# Patient Record
Sex: Female | Born: 1995 | Race: Black or African American | Hispanic: No | Marital: Single
Health system: Southern US, Community
[De-identification: ages and names within clinical notes are randomized; demographics above are authoritative.]

---

## 2019-10-29 ENCOUNTER — Emergency Department
Admission: EM | Admit: 2019-10-29 | Discharge: 2019-10-29 | Disposition: A | Discharge status: Home / Self Care | Payer: Medicaid Other | Attending: Emergency Medicine | Admitting: Emergency Medicine

## 2019-10-29 ENCOUNTER — Emergency Department: Payer: Medicaid Other

## 2019-10-29 ENCOUNTER — Other Ambulatory Visit: Payer: Self-pay

## 2019-10-29 DIAGNOSIS — F17200 Nicotine dependence, unspecified, uncomplicated: Secondary | ICD-10-CM | POA: Insufficient documentation

## 2019-10-29 DIAGNOSIS — R0602 Shortness of breath: Secondary | ICD-10-CM | POA: Diagnosis not present

## 2019-10-29 DIAGNOSIS — M79662 Pain in left lower leg: Secondary | ICD-10-CM | POA: Insufficient documentation

## 2019-10-29 DIAGNOSIS — M79661 Pain in right lower leg: Secondary | ICD-10-CM | POA: Diagnosis not present

## 2019-10-29 DIAGNOSIS — Z91040 Latex allergy status: Secondary | ICD-10-CM | POA: Diagnosis not present

## 2019-10-29 DIAGNOSIS — Z79899 Other long term (current) drug therapy: Secondary | ICD-10-CM | POA: Insufficient documentation

## 2019-10-29 DIAGNOSIS — R079 Chest pain, unspecified: Secondary | ICD-10-CM | POA: Diagnosis present

## 2019-10-29 LAB — CBC
HCT: 40.6 % (ref 36.0–46.0)
Hemoglobin: 14.1 g/dL (ref 12.0–15.0)
MCH: 29.3 pg (ref 26.0–34.0)
MCHC: 34.7 g/dL (ref 30.0–36.0)
MCV: 84.2 fL (ref 80.0–100.0)
Platelets: 234 10*3/uL (ref 150–400)
RBC: 4.82 MIL/uL (ref 3.87–5.11)
RDW: 12.9 % (ref 11.5–15.5)
WBC: 5.1 10*3/uL (ref 4.0–10.5)
nRBC: 0 % (ref 0.0–0.2)

## 2019-10-29 LAB — BASIC METABOLIC PANEL
Anion gap: 9 (ref 5–15)
BUN: 13 mg/dL (ref 6–20)
CO2: 25 mmol/L (ref 22–32)
Calcium: 9.4 mg/dL (ref 8.9–10.3)
Chloride: 103 mmol/L (ref 98–111)
Creatinine, Ser: 0.69 mg/dL (ref 0.44–1.00)
GFR calc Af Amer: >60 mL/min (ref >60)
GFR calc non Af Amer: >60 mL/min (ref >60)
Glucose, Bld: 129 mg/dL — ABNORMAL HIGH (ref 70–99)
Potassium: 3.7 mmol/L (ref 3.5–5.1)
Sodium: 137 mmol/L (ref 135–145)

## 2019-10-29 LAB — TROPONIN I (HIGH SENSITIVITY): Troponin I (High Sensitivity): <2 ng/L (ref <18)

## 2019-10-29 LAB — FIBRIN DERIVATIVES D-DIMER (ARMC ONLY): Fibrin derivatives D-dimer (ARMC): 212.4 ng/mL (FEU) (ref 0.00–499.00)

## 2019-10-29 LAB — POCT PREGNANCY, URINE: Preg Test, Ur: NEGATIVE

## 2019-10-29 NOTE — ED Triage Notes (Signed)
Patient reports chest pain off/on for several weeks, worse tonight.  Reports had episode approximately a year ago but no follow up.

## 2019-10-29 NOTE — ED Notes (Signed)
Patient to stat desk in no acute distress asking about wait time. Patient given update on wait time. Patient verbalizes understanding.  

## 2019-10-29 NOTE — ED Notes (Signed)
Pt c/o CP last night while eating. Pt states she was unable to eat her food d/t the pain and also SOB. 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

## 2019-10-29 NOTE — ED Provider Notes (Signed)
[facility] Emergency Department Provider Note  ____________________________________________   First MD Initiated Contact with Patient 10/29/19 [phone]     (approximate)  I have reviewed the triage vital signs and the nursing notes.   HISTORY  Chief Complaint Chest Pain    HPI Kristi Stewart is a 24 y.o. female presents emergency department complaining of some chest pain and shortness of breath.  Patient has a history of a questionable MI in March.  Patient states her legs have been hurting.  Patient does smoke and is on birth control, she denies any fever or chills.  No vomiting or diarrhea.  No known Covid exposure.    No past medical history on file.  There are no problems to display for this patient.     Prior to Admission medications   Not on File    Allergies Mushroom extract complex and Latex  No family history on file.  Social History Social History   Tobacco Use  . Smoking status: Not on file  Substance Use Topics  . Alcohol use: Not on file  . Drug use: Not on file    Review of Systems  Constitutional: No fever/chills Eyes: No visual changes. ENT: No sore throat. Respiratory: Denies cough, positive shortness of breath Cardiovascular: Positive chest pain Gastrointestinal: Denies abdominal pain Genitourinary: Negative for dysuria. Musculoskeletal: Negative for back pain. Skin: Negative for rash. Psychiatric: no mood changes,     ____________________________________________   PHYSICAL EXAM:  VITAL SIGNS: ED Triage Vitals  Enc Vitals Group     BP 10/29/19 0047 123/78     Pulse Rate 10/29/19 0047 77     Resp 10/29/19 0047 16     Temp 10/29/19 0047 99.1 F (37.3 C)     Temp Source 10/29/19 0047 Oral     SpO2 10/29/19 0047 100 %     Weight 10/29/19 0045 125 lb (56.7 kg)     Height 10/29/19 0045 4\' 11"  (1.499 m)     Head Circumference --      Peak Flow --      Pain Score 10/29/19 0045 3     Pain Loc  --      Pain Edu? --      Excl. in [place]? --     Constitutional: Alert and oriented. Well appearing and in no acute distress. Eyes: Conjunctivae are normal.  Head: Atraumatic. Nose: No congestion/rhinnorhea. Mouth/Throat: Mucous membranes are moist.   Neck:  supple no lymphadenopathy noted Cardiovascular: Normal rate, regular rhythm. Heart sounds are normal Respiratory: Normal respiratory effort.  No retractions, lungs c t a  GU: deferred Musculoskeletal: FROM all extremities, warm and well perfused Neurologic:  Normal speech and language.  Skin:  Skin is warm, dry and intact. No rash noted. Psychiatric: Mood and affect are normal. Speech and behavior are normal.  ____________________________________________   LABS (all labs ordered are listed, but only abnormal results are displayed)  Labs Reviewed  BASIC METABOLIC PANEL - Abnormal; Notable for the following components:      Result Value   Glucose, Bld 129 (*)    All other components within normal limits  CBC  FIBRIN DERIVATIVES D-DIMER ([facility] ONLY)  POC URINE PREG, ED  POCT PREGNANCY, URINE  TROPONIN I (HIGH SENSITIVITY)  TROPONIN I (HIGH SENSITIVITY)   ____________________________________________   ____________________________________________  RADIOLOGY  Chest x-ray is normal  ____________________________________________   PROCEDURES  Procedure(s) performed: No  Procedures    ____________________________________________   INITIAL IMPRESSION /  ASSESSMENT AND PLAN / ED COURSE  Pertinent labs & imaging results that were available during my care of the patient were reviewed by me and considered in my medical decision making (see chart for details).   The patient is a 24 year old female presents emergency department complaining of some chest pain and shortness of breath.  Patient is on birth control, is a smoker, and is having lower leg pain.  Physical exam is basically unremarkable  Will order a ddimer to  see if the patient needs a ct scan.  If neg she can be discharged.  She appears to be stable at this time.  DDx: Chest wall pain, PE, Covid, pneumonia  CBC is normal, basic metabolic panel is normal, troponin is normal Chest x-ray is normal   D-dimer is normal.  Discussed the findings with patient.  I am asked her if she thought she would need a Covid test.  She states she does not feel like she is actually sick.  She states that her chest does feel better than it did when she got here.  Instructed her to return immediately if she begins to have worsening chest pain or any shortness of breath.  She states she understands will comply.  She was discharged and given a work note.   Kristi Stewart was evaluated in Emergency Department on 10/29/2019 for the symptoms described in the history of present illness. She was evaluated in the context of the global COVID-19 pandemic, which necessitated consideration that the patient might be at risk for infection with the SARS-CoV-2 virus that causes COVID-19. Institutional protocols and algorithms that pertain to the evaluation of patients at risk for COVID-19 are in a state of rapid change based on information released by regulatory bodies including the CDC and federal and state organizations. These policies and algorithms were followed during the patient's care in the ED.   As part of my medical decision making, I reviewed the following data within the electronic MEDICAL RECORD NUMBER Nursing notes reviewed and incorporated, Labs reviewed see above, EKG interpreted NSR, Old chart reviewed, Radiograph reviewed chest x-ray is normal, Notes from prior ED visits and [place] Controlled Substance Database  ____________________________________________   FINAL CLINICAL IMPRESSION(S) / ED DIAGNOSES  Final diagnoses:  Nonspecific chest pain      NEW MEDICATIONS STARTED DURING THIS VISIT:  New Prescriptions   No medications on file     Note:  This  document was prepared using Dragon voice recognition software and may include unintentional dictation errors.    Faythe Ghee, PA-C 10/29/19 1012    Phineas Semen, MD 10/29/19 [phone]

## 2019-10-29 NOTE — Discharge Instructions (Signed)
Follow-up with your regular doctor if not improving in 3 days.  Return emergency department worsening.  Tylenol and ibuprofen as needed.  If you start to run a fever and have a cough please return the emergency department.

## 2020-10-15 IMAGING — CR DG CHEST 2V
2 series · 2 of 2 positions shown · non-contrast
Comparison: None.

CLINICAL DATA: Chest pain for several weeks

EXAM:
CHEST - 2 VIEW

[chest pa]
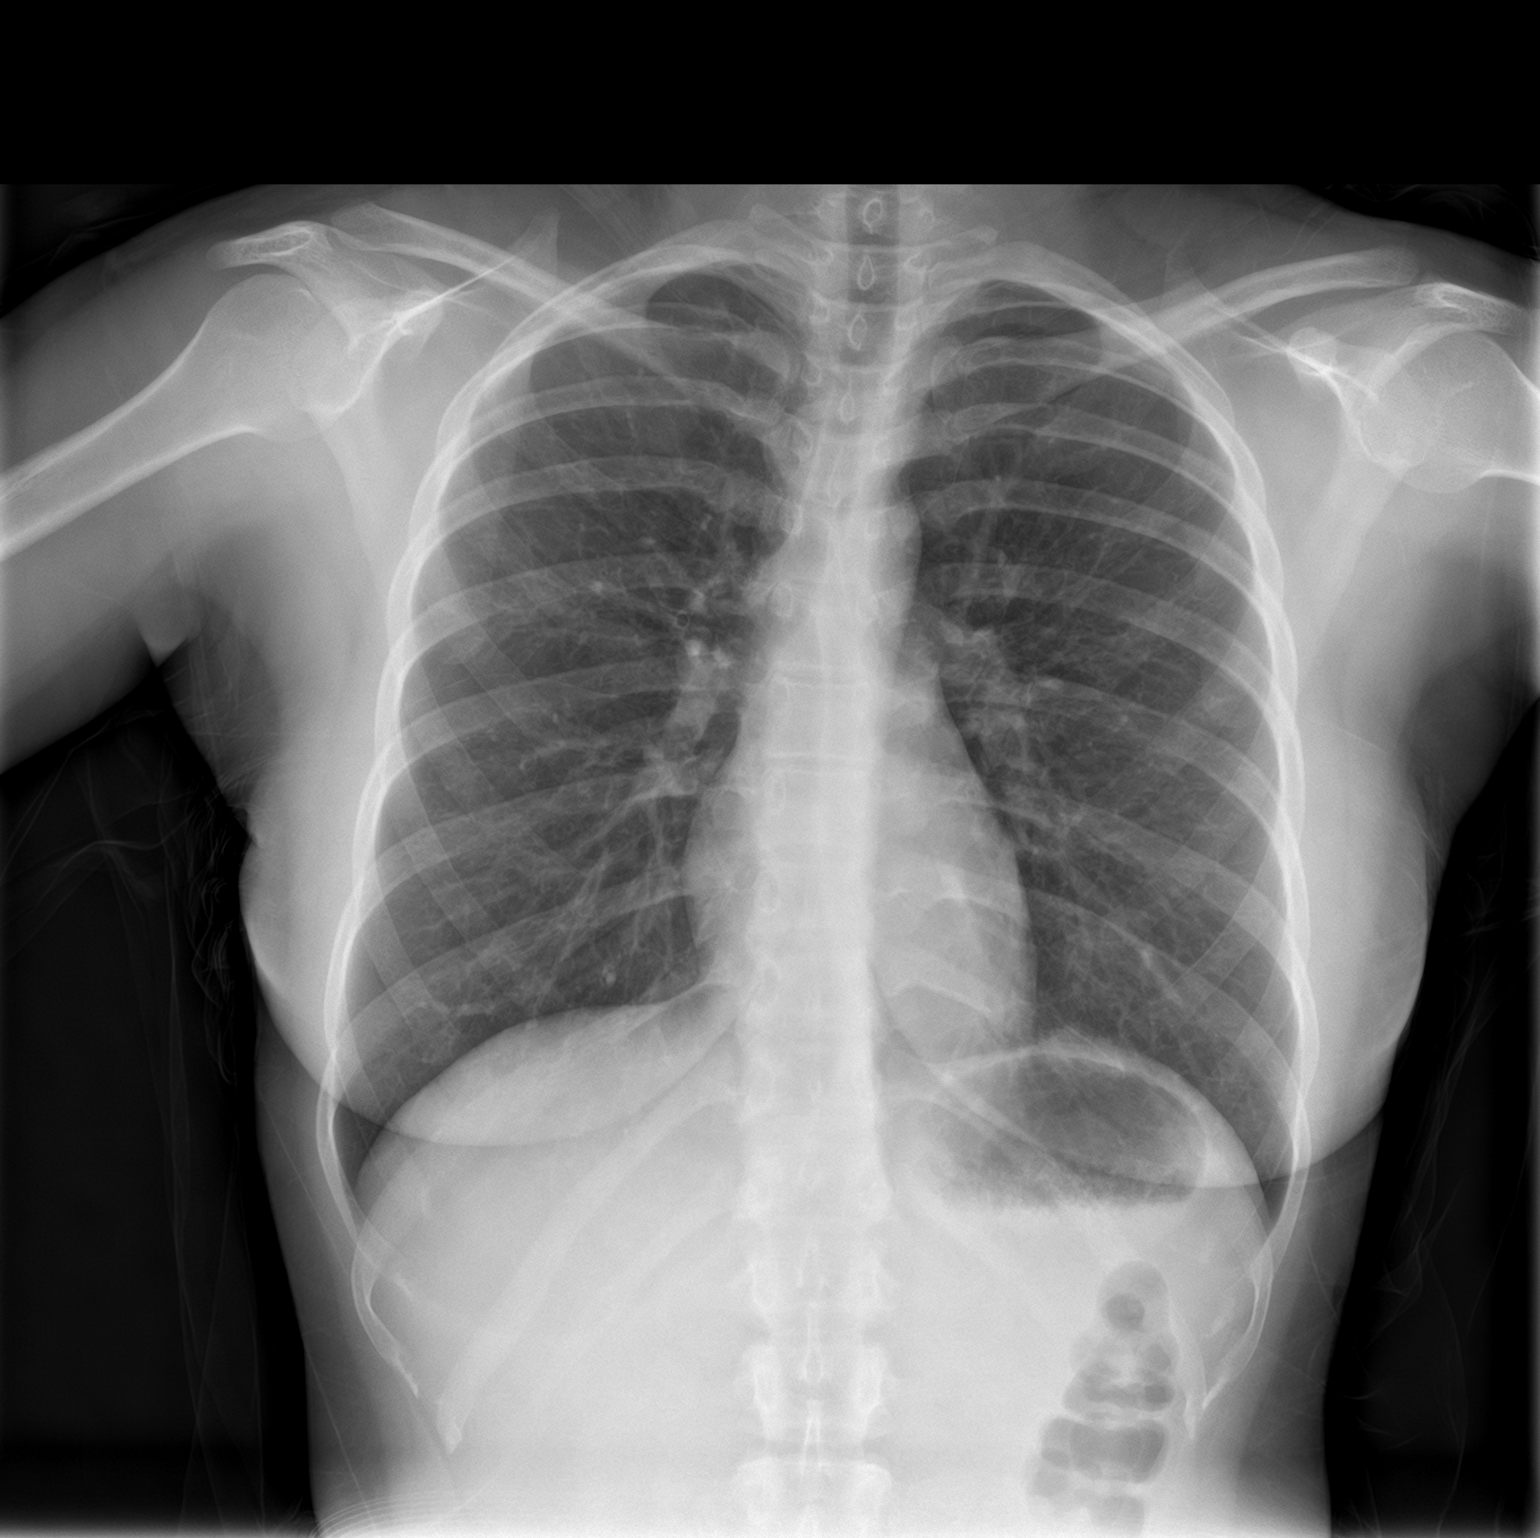

[chest lat]
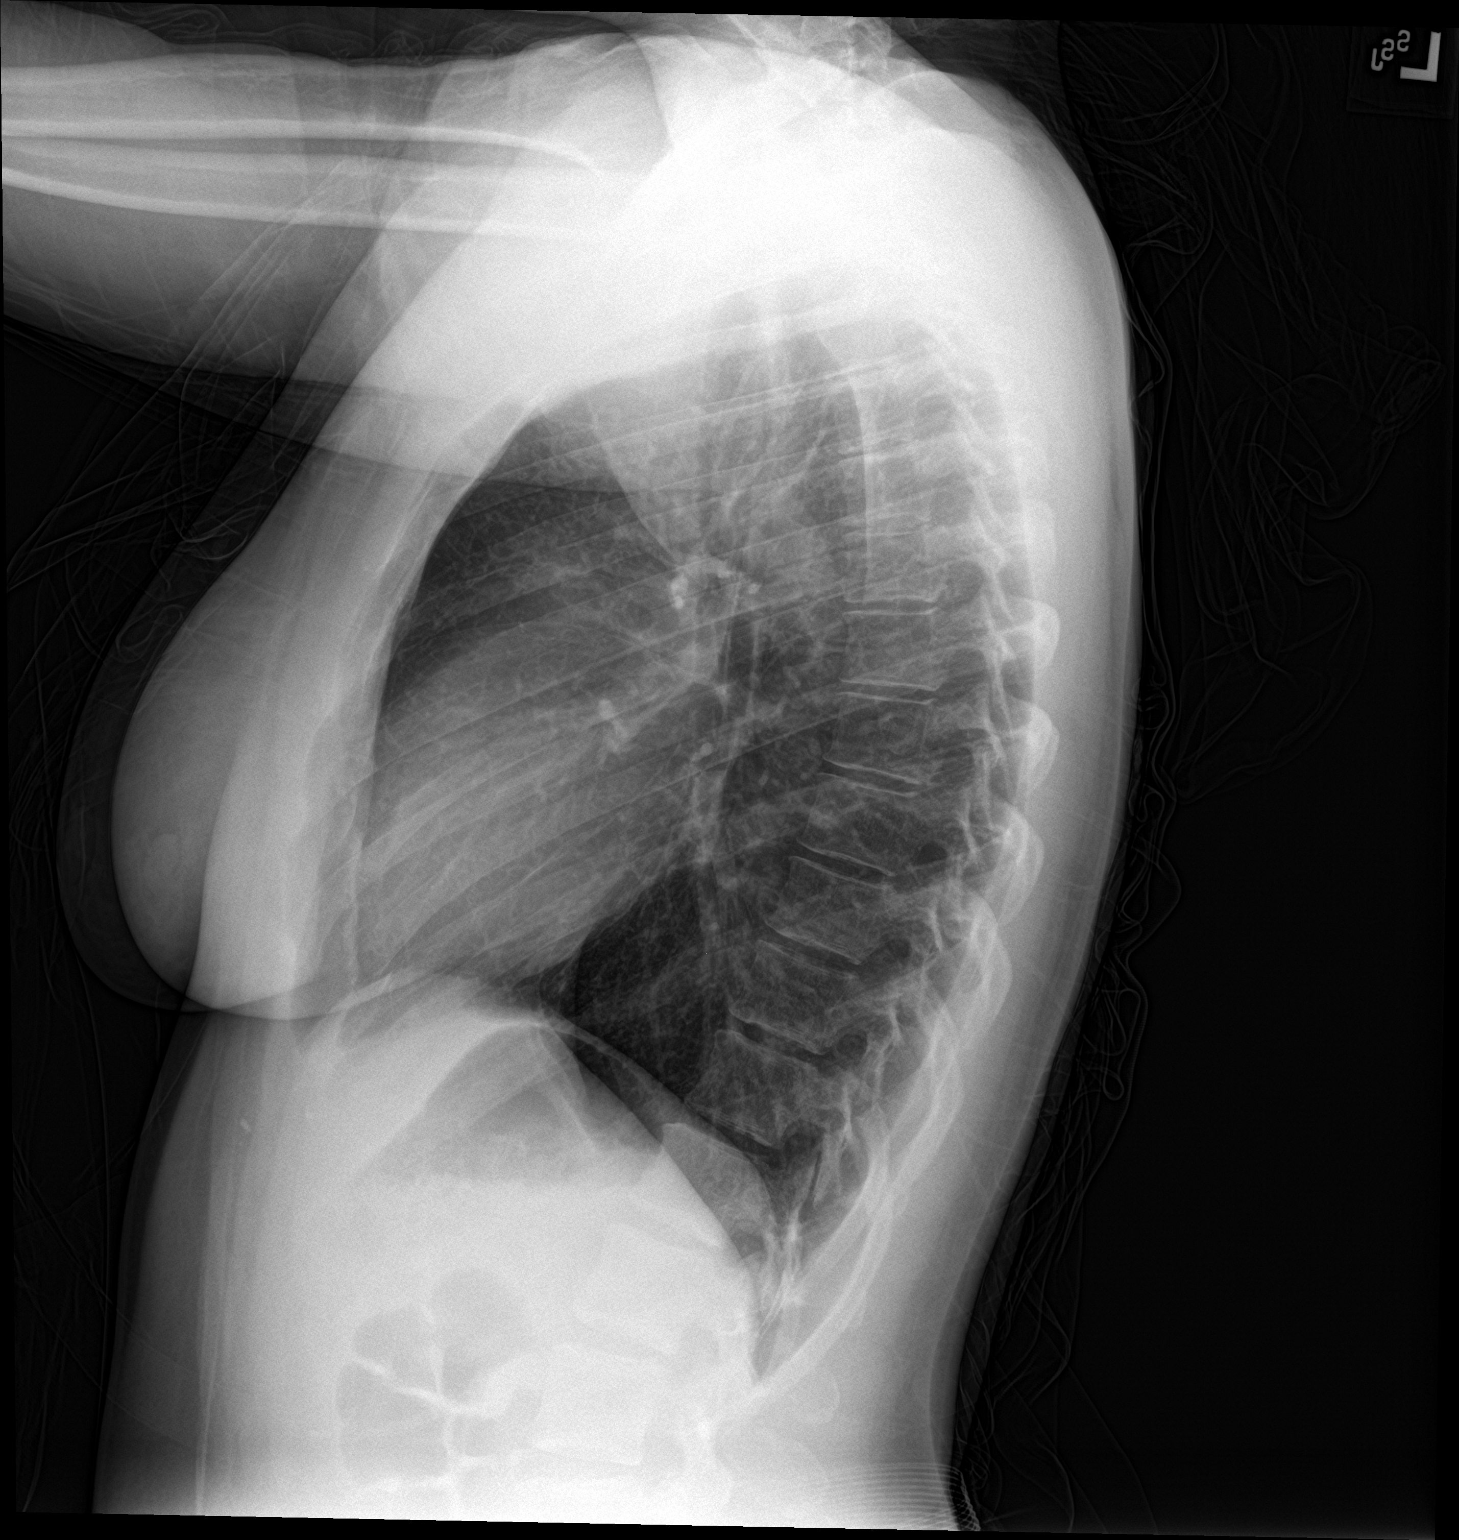

[2 of 2 positions shown; findings below may reference images not displayed]

FINDINGS: The heart size and mediastinal contours are within normal limits.
Both lungs are clear. The visualized skeletal structures are
unremarkable.
IMPRESSION: No active cardiopulmonary disease.
